# Patient Record
Sex: Female | Born: 2000 | Race: Black or African American | Hispanic: No | Marital: Single | Smoking: Never smoker
Health system: Southern US, Community
[De-identification: ages and names within clinical notes are randomized; demographics above are authoritative.]

## PROBLEM LIST (undated history)

## (undated) HISTORY — PX: NO PAST SURGERIES: SHX2092

---
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
## 2020-08-16 ENCOUNTER — Ambulatory Visit: Payer: Medicaid Other | Attending: Internal Medicine | Admitting: Internal Medicine

## 2020-08-16 ENCOUNTER — Encounter: Payer: Self-pay | Admitting: Internal Medicine

## 2020-08-16 ENCOUNTER — Other Ambulatory Visit: Payer: Self-pay

## 2020-08-16 VITALS — BP 120/85 | HR 68 | Temp 99.4°F | Resp 16 | Ht 67.0 in | Wt 226.0 lb

## 2020-08-16 DIAGNOSIS — N926 Irregular menstruation, unspecified: Secondary | ICD-10-CM

## 2020-08-16 DIAGNOSIS — Z6835 Body mass index (BMI) 35.0-35.9, adult: Secondary | ICD-10-CM

## 2020-08-16 DIAGNOSIS — N911 Secondary amenorrhea: Secondary | ICD-10-CM | POA: Insufficient documentation

## 2020-08-16 DIAGNOSIS — E66812 Obesity, class 2: Secondary | ICD-10-CM | POA: Insufficient documentation

## 2020-08-16 DIAGNOSIS — E6609 Other obesity due to excess calories: Secondary | ICD-10-CM

## 2020-08-16 DIAGNOSIS — R030 Elevated blood-pressure reading, without diagnosis of hypertension: Secondary | ICD-10-CM

## 2020-08-16 MED ORDER — NORGESTREL-ETHINYL ESTRADIOL 0.3-30 MG-MCG PO TABS: 1.0000 | ORAL_TABLET | Freq: Every day | ORAL | 2 refills | Status: DC

## 2020-08-16 MED FILL — ELINEST 0.3-30 MG-MCG TABS: 0.3-30 | 28 days supply | Qty: 28 | Fill #0

## 2020-08-16 NOTE — Patient Instructions (Signed)
Polycystic Ovarian Syndrome  Polycystic ovarian syndrome (PCOS) is a common hormonal disorder among women of reproductive age. In most women with PCOS, many small fluid-filled sacs (cysts) grow on the ovaries, and the cysts are not part of a normal menstrual cycle. PCOS can cause problems with your menstrual periods and make it difficult to get pregnant. It can also cause an increased risk of miscarriage with pregnancy. If it is not treated, PCOS can lead to serious health problems, such as diabetes and heart disease. What are the causes? The cause of PCOS is not known, but it may be the result of a combination of certain factors, such as:  Irregular menstrual cycle.  High levels of certain hormones (androgens).  Problems with the hormone that helps to control blood sugar (insulin resistance).  Certain genes. What increases the risk? This condition is more likely to develop in women who have a family history of PCOS. What are the signs or symptoms? Symptoms of PCOS may include:  Multiple ovarian cysts.  Infrequent periods or no periods.  Periods that are too frequent or too heavy.  Unpredictable periods.  Inability to get pregnant (infertility) because of not ovulating.  Increased growth of hair on the face, chest, stomach, back, thumbs, thighs, or toes.  Acne or oily skin. Acne may develop during adulthood, and it may not respond to treatment.  Pelvic pain.  Weight gain or obesity.  Patches of thickened and dark brown or black skin on the neck, arms, breasts, or thighs (acanthosis nigricans).  Excess hair growth on the face, chest, abdomen, or upper thighs (hirsutism). How is this diagnosed? This condition is diagnosed based on:  Your medical history.  A physical exam, including a pelvic exam. Your health care provider may look for areas of increased hair growth on your skin.  Tests, such as: ? Ultrasound. This may be used to examine the ovaries and the lining of the  uterus (endometrium) for cysts. ? Blood tests. These may be used to check levels of sugar (glucose), female hormone (testosterone), and female hormones (estrogen and progesterone) in your blood. How is this treated? There is no cure for PCOS, but treatment can help to manage symptoms and prevent more health problems from developing. Treatment varies depending on:  Your symptoms.  Whether you want to have a baby or whether you need birth control (contraception). Treatment may include nutrition and lifestyle changes along with:  Progesterone hormone to start a menstrual period.  Birth control pills to help you have regular menstrual periods.  Medicines to make you ovulate, if you want to get pregnant.  Medicine to reduce excessive hair growth.  Surgery, in severe cases. This may involve making small holes in one or both of your ovaries. This decreases the amount of testosterone that your body produces. Follow these instructions at home:  Take over-the-counter and prescription medicines only as told by your health care provider.  Follow a healthy meal plan. This can help you reduce the effects of PCOS. ? Eat a healthy diet that includes lean proteins, complex carbohydrates, fresh fruits and vegetables, low-fat dairy products, and healthy fats. Make sure to eat enough fiber.  If you are overweight, lose weight as told by your health care provider. ? Losing 10% of your body weight may improve symptoms. ? Your health care provider can determine how much weight loss is best for you and can help you lose weight safely.  Keep all follow-up visits as told by your health care provider.   This is important. Contact a health care provider if:  Your symptoms do not get better with medicine.  You develop new symptoms. This information is not intended to replace advice given to you by your health care provider. Make sure you discuss any questions you have with your health care provider. Document  Revised: 11/20/2017 Document Reviewed: 05/25/2016 Elsevier Patient Education  2020 Elsevier Inc.   DASH Eating Plan DASH stands for "Dietary Approaches to Stop Hypertension." The DASH eating plan is a healthy eating plan that has been shown to reduce high blood pressure (hypertension). It may also reduce your risk for type 2 diabetes, heart disease, and stroke. The DASH eating plan may also help with weight loss. What are tips for following this plan?  General guidelines  Avoid eating more than 2,300 mg (milligrams) of salt (sodium) a day. If you have hypertension, you may need to reduce your sodium intake to 1,500 mg a day.  Limit alcohol intake to no more than 1 drink a day for nonpregnant women and 2 drinks a day for men. One drink equals 12 oz of beer, 5 oz of wine, or 1 oz of hard liquor.  Work with your health care provider to maintain a healthy body weight or to lose weight. Ask what an ideal weight is for you.  Get at least 30 minutes of exercise that causes your heart to beat faster (aerobic exercise) most days of the week. Activities may include walking, swimming, or biking.  Work with your health care provider or diet and nutrition specialist (dietitian) to adjust your eating plan to your individual calorie needs. Reading food labels   Check food labels for the amount of sodium per serving. Choose foods with less than 5 percent of the Daily Value of sodium. Generally, foods with less than 300 mg of sodium per serving fit into this eating plan.  To find whole grains, look for the word "whole" as the first word in the ingredient list. Shopping  Buy products labeled as "low-sodium" or "no salt added."  Buy fresh foods. Avoid canned foods and premade or frozen meals. Cooking  Avoid adding salt when cooking. Use salt-free seasonings or herbs instead of table salt or sea salt. Check with your health care provider or pharmacist before using salt substitutes.  Do not fry foods.  Cook foods using healthy methods such as baking, boiling, grilling, and broiling instead.  Cook with heart-healthy oils, such as olive, canola, soybean, or sunflower oil. Meal planning  Eat a balanced diet that includes: ? 5 or more servings of fruits and vegetables each day. At each meal, try to fill half of your plate with fruits and vegetables. ? Up to 6-8 servings of whole grains each day. ? Less than 6 oz of lean meat, poultry, or fish each day. A 3-oz serving of meat is about the same size as a deck of cards. One egg equals 1 oz. ? 2 servings of low-fat dairy each day. ? A serving of nuts, seeds, or beans 5 times each week. ? Heart-healthy fats. Healthy fats called Omega-3 fatty acids are found in foods such as flaxseeds and coldwater fish, like sardines, salmon, and mackerel.  Limit how much you eat of the following: ? Canned or prepackaged foods. ? Food that is high in trans fat, such as fried foods. ? Food that is high in saturated fat, such as fatty meat. ? Sweets, desserts, sugary drinks, and other foods with added sugar. ? Full-fat dairy products.  Do not salt foods before eating.  Try to eat at least 2 vegetarian meals each week.  Eat more home-cooked food and less restaurant, buffet, and fast food.  When eating at a restaurant, ask that your food be prepared with less salt or no salt, if possible. What foods are recommended? The items listed may not be a complete list. Talk with your dietitian about what dietary choices are best for you. Grains Whole-grain or whole-wheat bread. Whole-grain or whole-wheat pasta. Brown rice. Orpah Cobb. Bulgur. Whole-grain and low-sodium cereals. Pita bread. Low-fat, low-sodium crackers. Whole-wheat flour tortillas. Vegetables Fresh or frozen vegetables (raw, steamed, roasted, or grilled). Low-sodium or reduced-sodium tomato and vegetable juice. Low-sodium or reduced-sodium tomato sauce and tomato paste. Low-sodium or reduced-sodium  canned vegetables. Fruits All fresh, dried, or frozen fruit. Canned fruit in natural juice (without added sugar). Meat and other protein foods Skinless chicken or [place]. Ground chicken or [place]. Pork with fat trimmed off. Fish and seafood. Egg whites. Dried beans, peas, or lentils. Unsalted nuts, nut butters, and seeds. Unsalted canned beans. Lean cuts of beef with fat trimmed off. Low-sodium, lean deli meat. Dairy Low-fat (1%) or fat-free (skim) milk. Fat-free, low-fat, or reduced-fat cheeses. Nonfat, low-sodium ricotta or cottage cheese. Low-fat or nonfat yogurt. Low-fat, low-sodium cheese. Fats and oils Soft margarine without trans fats. Vegetable oil. Low-fat, reduced-fat, or light mayonnaise and salad dressings (reduced-sodium). Canola, safflower, olive, soybean, and sunflower oils. Avocado. Seasoning and other foods Herbs. Spices. Seasoning mixes without salt. Unsalted popcorn and pretzels. Fat-free sweets. What foods are not recommended? The items listed may not be a complete list. Talk with your dietitian about what dietary choices are best for you. Grains Baked goods made with fat, such as croissants, muffins, or some breads. Dry pasta or rice meal packs. Vegetables Creamed or fried vegetables. Vegetables in a cheese sauce. Regular canned vegetables (not low-sodium or reduced-sodium). Regular canned tomato sauce and paste (not low-sodium or reduced-sodium). Regular tomato and vegetable juice (not low-sodium or reduced-sodium). Rosita Fire. Olives. Fruits Canned fruit in a light or heavy syrup. Fried fruit. Fruit in cream or butter sauce. Meat and other protein foods Fatty cuts of meat. Ribs. Fried meat. Tomasa Blase. Sausage. Bologna and other processed lunch meats. Salami. Fatback. Hotdogs. Bratwurst. Salted nuts and seeds. Canned beans with added salt. Canned or smoked fish. Whole eggs or egg yolks. Chicken or [place] with skin. Dairy Whole or 2% milk, cream, and half-and-half. Whole or  full-fat cream cheese. Whole-fat or sweetened yogurt. Full-fat cheese. Nondairy creamers. Whipped toppings. Processed cheese and cheese spreads. Fats and oils Butter. Stick margarine. Lard. Shortening. Ghee. Bacon fat. Tropical oils, such as coconut, palm kernel, or palm oil. Seasoning and other foods Salted popcorn and pretzels. Onion salt, garlic salt, seasoned salt, table salt, and sea salt. Worcestershire sauce. Tartar sauce. Barbecue sauce. Teriyaki sauce. Soy sauce, including reduced-sodium. Steak sauce. Canned and packaged gravies. Fish sauce. Oyster sauce. Cocktail sauce. Horseradish that you find on the shelf. Ketchup. Mustard. Meat flavorings and tenderizers. Bouillon cubes. Hot sauce and Tabasco sauce. Premade or packaged marinades. Premade or packaged taco seasonings. Relishes. Regular salad dressings. Where to find more information:  National Heart, Lung, and Blood Institute: PopSteam.is  American Heart Association: www.heart.org Summary  The DASH eating plan is a healthy eating plan that has been shown to reduce high blood pressure (hypertension). It may also reduce your risk for type 2 diabetes, heart disease, and stroke.  With the DASH eating plan, you should limit salt (sodium)  intake to 2,300 mg a day. If you have hypertension, you may need to reduce your sodium intake to 1,500 mg a day.  When on the DASH eating plan, aim to eat more fresh fruits and vegetables, whole grains, lean proteins, low-fat dairy, and heart-healthy fats.  Work with your health care provider or diet and nutrition specialist (dietitian) to adjust your eating plan to your individual calorie needs. This information is not intended to replace advice given to you by your health care provider. Make sure you discuss any questions you have with your health care provider. Document Revised: 11/20/2017 Document Reviewed: 12/01/2016 Elsevier Patient Education  2020 [employer].

## 2020-08-16 NOTE — Progress Notes (Signed)
Pt states she has questions regarding her menstrual cycle

## 2020-08-16 NOTE — Progress Notes (Signed)
Patient ID: Pamela Barton, female    DOB: 2001/01/05  MRN: [id number]  CC: New Patient (Initial Visit)   Subjective: Pamela Barton is a 19 y.o. female who presents for new pt visit Her concerns today include:   Previous PCP was in [place].  No previous chronic med conditions.  Not on any meds.    Pt c/o irregular menses since she was younger.  Breast development started at age 80.  Menses started at age 15. Initial menses lasted 2 wks then no cycle again until she was 19 yrs old and lasted 2 mths.  Nothing again until 3 mths ago.  That bleeding lasted 1 mth.  Not on any method of birth control.  Never tried on birth control to try to regulate cycles. -menses heavy for first 2 days then subsequent days are lighter. -had pelvic exam by PCP last yr and CT scan.  She was told by her mom that CT scan may have shown an ovarian cyst.  -endorses hair growth on chin that started at age 62.  She shaves Q 2-3 days.   Some deepen of voice.   -not sexually active and has never been in the past..  Obesity:  Reports she does poorly with eating habits.  She tends to not eat well for several days and then overeats.  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  She also gets an exercise outside of work by watching and following YouTube exercise videos or jogging for 10 minutes.  Past medical, surgical, social, family history reviewed and updated.  No Known Allergies  Social History   Socioeconomic History  . Marital status: Single    Spouse name: Not on file  . Number of children: 0  . Years of education: 7  . Highest education level: Not on file  Occupational History  . Occupation: janitor  Tobacco Use  . Smoking status: Never Smoker  . Smokeless tobacco: Never Used  Vaping Use  . Vaping Use: Never used  Substance and Sexual Activity  . Alcohol use: Never  . Drug use: Never  . Sexual activity: Never  Other Topics Concern  . Not on file  Social History Narrative  . Not on file    Social Determinants of Health   Financial Resource Strain:   . Difficulty of Paying Living Expenses: Not on file  Food Insecurity:   . Worried About Programme researcher, broadcasting/film/video in the Last Year: Not on file  . Ran Out of Food in the Last Year: Not on file  Transportation Needs:   . Lack of Transportation (Medical): Not on file  . Lack of Transportation (Non-Medical): Not on file  Physical Activity:   . Days of Exercise per Week: Not on file  . Minutes of Exercise per Session: Not on file  Stress:   . Feeling of Stress : Not on file  Social Connections:   . Frequency of Communication with Friends and Family: Not on file  . Frequency of Social Gatherings with Friends and Family: Not on file  . Attends Religious Services: Not on file  . Active Member of Clubs or Organizations: Not on file  . Attends Banker Meetings: Not on file  . Marital Status: Not on file  Intimate Partner Violence:   . Fear of Current or Ex-Partner: Not on file  . Emotionally Abused: Not on file  . Physically Abused: Not on file  . Sexually Abused: Not on file  Family History  Problem Relation Age of Onset  . Diabetes Paternal Grandmother     Past Surgical History:  Procedure Laterality Date  . NO PAST SURGERIES      ROS: Review of Systems Negative except as stated above  PHYSICAL EXAM: BP 123/85   Pulse 68   Temp 99.4 F (37.4 C)   Resp 16   Ht 5\' 7"  (1.702 m)   Wt 226 lb (102.5 kg)   SpO2 100%   BMI 35.40 kg/m   Blood pressure 120/85 bilaterally. Physical Exam  General appearance - alert, well appearing, and in no distress Mental status - normal mood, behavior, speech, dress, motor activity, and thought processes Eyes - pupils equal and reactive, extraocular eye movements intact Mouth - mucous membranes moist, pharynx normal without lesions Neck - supple, no significant adenopathy Chest - clear to auscultation, no wheezes, rales or rhonchi, symmetric air entry Heart -  normal rate, regular rhythm, normal S1, S2, no murmurs, rubs, clicks or gallops Extremities - no LE edema Skin - acanthosis nigricans around neck line Acanthosis nigricans.around neck line.  Mild hirsutism on extremities, trunk.  Spares hirsutism on breast, mild under chin  ASSESSMENT AND PLAN: 1. Secondary amenorrhea Likely anovulatory cycle due to PCOS. Discussed dx with pt and management.  Recommend trail of BCP to help regulate menses vs Provera once a mth for 1 wk to bring about withdrawal bleeding once a mth.  Pt willing to try the BCP.  Went over possible side of DVT/PE and signs/symptoms to watch for.  No previous hx of blood clots or liver ds. Declines urine pregnancy test as she is not sexually active. -went over how to take BCP and when to expect withdrawal bleeding.   - TSH - Prolactin  2. Irregular menses See #1 above - TSH - Follicle stimulating hormone - Luteinizing hormone - Prolactin - norgestrel-ethinyl estradiol (LO/OVRAL) 0.3-30 MG-MCG tablet; Take 1 tablet by mouth daily.  Dispense: 28 tablet; Refill: 2  3. Class 2 obesity due to excess calories without serious comorbidity with body mass index (BMI) of 35.0 to 35.9 in adult -discuss importance of wealthy eating habits.  Discuss importance of smaller portions, incorporating fresh fruits and veggies in the diet, cutting back on white carbs and sugar drinks. -encourage some form of moderate intensity exercise 3-4 days a week for 30-45 mins - Hemoglobin A1c  4. Elevated blood-pressure reading without diagnosis of hypertension DASH diet discussed and encouraged.  Plan to recheck BP on f/u visit - Basic Metabolic Panel     Patient was given the opportunity to ask questions.  Patient verbalized understanding of the plan and was able to repeat key elements of the plan.   Orders Placed This Encounter  Procedures  . TSH  . Follicle stimulating hormone  . Luteinizing hormone  . Hemoglobin A1c  . Prolactin  . Basic  Metabolic Panel     Requested Prescriptions   Signed Prescriptions Disp Refills  . norgestrel-ethinyl estradiol (LO/OVRAL) 0.3-30 MG-MCG tablet 28 tablet 2    Sig: Take 1 tablet by mouth daily.    Return in about 6 weeks (around 09/27/2020).  11/27/2020, MD, FACP

## 2020-08-17 LAB — BASIC METABOLIC PANEL
BUN/Creatinine Ratio: 13 (ref 9–23)
BUN: 11 mg/dL (ref 6–20)
CO2: 25 mmol/L (ref 20–29)
Calcium: 10.4 mg/dL — ABNORMAL HIGH (ref 8.7–10.2)
Chloride: 100 mmol/L (ref 96–106)
Creatinine, Ser: 0.85 mg/dL (ref 0.57–1.00)
GFR calc Af Amer: 115 mL/min/{1.73_m2} (ref >59)
GFR calc non Af Amer: 100 mL/min/{1.73_m2} (ref >59)
Glucose: 80 mg/dL (ref 65–99)
Potassium: 5.1 mmol/L (ref 3.5–5.2)
Sodium: 139 mmol/L (ref 134–144)

## 2020-08-17 LAB — HEMOGLOBIN A1C
Est. average glucose Bld gHb Est-mCnc: 120 mg/dL
Hgb A1c MFr Bld: 5.8 % — ABNORMAL HIGH (ref 4.8–5.6)

## 2020-08-17 LAB — PROLACTIN: Prolactin: 15.9 ng/mL (ref 4.8–23.3)

## 2020-08-17 LAB — LUTEINIZING HORMONE: LH: 14.4 m[IU]/mL

## 2020-08-17 LAB — TSH: TSH: 1.88 u[IU]/mL (ref 0.450–4.500)

## 2020-08-17 LAB — FOLLICLE STIMULATING HORMONE: FSH: 3.8 m[IU]/mL

## 2020-08-18 ENCOUNTER — Telehealth: Payer: Self-pay

## 2020-08-18 NOTE — Telephone Encounter (Signed)
Contacted pt to go over lab results pt is aware and doesn't have any questions or concerns 

## 2020-10-22 ENCOUNTER — Ambulatory Visit: Payer: Medicaid Other | Attending: Internal Medicine | Admitting: Internal Medicine

## 2020-10-22 ENCOUNTER — Encounter: Payer: Self-pay | Admitting: Internal Medicine

## 2020-10-22 ENCOUNTER — Other Ambulatory Visit: Payer: Self-pay

## 2020-10-22 VITALS — BP 115/75 | HR 76 | Resp 16 | Wt 224.6 lb

## 2020-10-22 DIAGNOSIS — E6609 Other obesity due to excess calories: Secondary | ICD-10-CM

## 2020-10-22 DIAGNOSIS — Z1331 Encounter for screening for depression: Secondary | ICD-10-CM

## 2020-10-22 DIAGNOSIS — Z6835 Body mass index (BMI) 35.0-35.9, adult: Secondary | ICD-10-CM

## 2020-10-22 DIAGNOSIS — N926 Irregular menstruation, unspecified: Secondary | ICD-10-CM | POA: Diagnosis not present

## 2020-10-22 DIAGNOSIS — R7303 Prediabetes: Secondary | ICD-10-CM | POA: Diagnosis not present

## 2020-10-22 DIAGNOSIS — Z2821 Immunization not carried out because of patient refusal: Secondary | ICD-10-CM

## 2020-10-22 MED ORDER — NORGESTREL-ETHINYL ESTRADIOL 0.3-30 MG-MCG PO TABS: 1.0000 | ORAL_TABLET | Freq: Every day | ORAL | 6 refills | Status: AC

## 2020-10-22 NOTE — Patient Instructions (Signed)
Please bring me a copy of your MRI report.   I have sent refills on the birth control pill.   Prediabetes Eating Plan Prediabetes is a condition that causes blood sugar (glucose) levels to be higher than normal. This increases the risk for developing diabetes. In order to prevent diabetes from developing, your health care provider may recommend a diet and other lifestyle changes to help you:  Control your blood glucose levels.  Improve your cholesterol levels.  Manage your blood pressure. Your health care provider may recommend working with a diet and nutrition specialist (dietitian) to make a meal plan that is best for you. What are tips for following this plan? Lifestyle  Set weight loss goals with the help of your health care team. It is recommended that most people with prediabetes lose 7% of their current body weight.  Exercise for at least 30 minutes at least 5 days a week.  Attend a support group or seek ongoing support from a mental health counselor.  Take over-the-counter and prescription medicines only as told by your health care provider. Reading food labels  Read food labels to check the amount of fat, salt (sodium), and sugar in prepackaged foods. Avoid foods that have: ? Saturated fats. ? Trans fats. ? Added sugars.  Avoid foods that have more than 300 milligrams (mg) of sodium per serving. Limit your daily sodium intake to less than 2,300 mg each day. Shopping  Avoid buying pre-made and processed foods. Cooking  Cook with olive oil. Do not use butter, lard, or ghee.  Bake, broil, grill, or boil foods. Avoid frying. Meal planning   Work with your dietitian to develop an eating plan that is right for you. This may include: ? Tracking how many calories you take in. Use a food diary, notebook, or mobile application to track what you eat at each meal. ? Using the glycemic index (GI) to plan your meals. The index tells you how quickly a food will raise your blood  glucose. Choose low-GI foods. These foods take a longer time to raise blood glucose.  Consider following a Mediterranean diet. This diet includes: ? Several servings each day of fresh fruits and vegetables. ? Eating fish at least twice a week. ? Several servings each day of whole grains, beans, nuts, and seeds. ? Using olive oil instead of other fats. ? Moderate alcohol consumption. ? Eating small amounts of red meat and whole-fat dairy.  If you have high blood pressure, you may need to limit your sodium intake or follow a diet such as the DASH eating plan. DASH is an eating plan that aims to lower high blood pressure. What foods are recommended? The items listed below may not be a complete list. Talk with your dietitian about what dietary choices are best for you. Grains Whole grains, such as whole-wheat or whole-grain breads, crackers, cereals, and pasta. Unsweetened oatmeal. Bulgur. Barley. Quinoa. Brown rice. Corn or whole-wheat flour tortillas or taco shells. Vegetables Lettuce. Spinach. Peas. Beets. Cauliflower. Cabbage. Broccoli. Carrots. Tomatoes. Squash. Eggplant. Herbs. Peppers. Onions. Cucumbers. Brussels sprouts. Fruits Berries. Bananas. Apples. Oranges. Grapes. Papaya. Mango. Pomegranate. Kiwi. Grapefruit. Cherries. Meats and other protein foods Seafood. Poultry without skin. Lean cuts of pork and beef. Tofu. Eggs. Nuts. Beans. Dairy Low-fat or fat-free dairy products, such as yogurt, cottage cheese, and cheese. Beverages Water. Tea. Coffee. Sugar-free or diet soda. Seltzer water. Lowfat or no-fat milk. Milk alternatives, such as soy or almond milk. Fats and oils Olive oil. Canola oil.  Sunflower oil. Grapeseed oil. Avocado. Walnuts. Sweets and desserts Sugar-free or low-fat pudding. Sugar-free or low-fat ice cream and other frozen treats. Seasoning and other foods Herbs. Sodium-free spices. Mustard. Relish. Low-fat, low-sugar ketchup. Low-fat, low-sugar barbecue sauce.  Low-fat or fat-free mayonnaise. What foods are not recommended? The items listed below may not be a complete list. Talk with your dietitian about what dietary choices are best for you. Grains Refined white flour and flour products, such as bread, pasta, snack foods, and cereals. Vegetables Canned vegetables. Frozen vegetables with butter or cream sauce. Fruits Fruits canned with syrup. Meats and other protein foods Fatty cuts of meat. Poultry with skin. Breaded or fried meat. Processed meats. Dairy Full-fat yogurt, cheese, or milk. Beverages Sweetened drinks, such as sweet iced tea and soda. Fats and oils Butter. Lard. Ghee. Sweets and desserts Baked goods, such as cake, cupcakes, pastries, cookies, and cheesecake. Seasoning and other foods Spice mixes with added salt. Ketchup. Barbecue sauce. Mayonnaise. Summary  To prevent diabetes from developing, you may need to make diet and other lifestyle changes to help control blood sugar, improve cholesterol levels, and manage your blood pressure.  Set weight loss goals with the help of your health care team. It is recommended that most people with prediabetes lose 7 percent of their current body weight.  Consider following a Mediterranean diet that includes plenty of fresh fruits and vegetables, whole grains, beans, nuts, seeds, fish, lean meat, low-fat dairy, and healthy oils. This information is not intended to replace advice given to you by your health care provider. Make sure you discuss any questions you have with your health care provider. Document Revised: 04/01/2019 Document Reviewed: 02/11/2017 Elsevier Patient Education  2020 [employer].

## 2020-10-22 NOTE — Progress Notes (Signed)
Patient ID: Pamela Barton, female    DOB: 31-Jul-2001  MRN: [id number]  CC: Follow-up   Subjective: Pamela Barton is a 19 y.o. female who presents for 6 weeks follow-up for irregular menses. Her concerns today include:  Patient with history of secondary amenorrhea, obesity, prediabetes   On her first visit with me in August, we discussed her irregular menses and secondary amenorrhea.  My thinking was that it was due to anovulatory cycle like PCOS.  She was started on LoOvral birth control pills to try to regulate her cycles.  Patient reports that she had a cycle in September that lasted 8 days and then 1 month later in October that lasted 6 days.  Both times menses came during the time she was taking the blank pills and the birth control pack.  Menses for this month just started today.  She is tolerating the birth control pills okay.  Her hormone levels were okay. -She was able to log into the system from her previous PCP to look for the CAT scan that she had done of the abdomen and pelvis.  She states that it was actually an MRI of the abdomen and pelvis and that the report said that her ovaries are large with the left side being larger than the right.  She did not bring a copy of the report for me to review today.  Obesity: She was found to have prediabetes on blood test.  Patient reports that she is moving more now that she is working at an [employer].  She works 10-hour shifts and is moving the whole time.  Doing better with eating habits.  Eating less.  Depression screen was positive on last visit with a PHQ-9 score of 12.  Today her score is 2.  She denies any major issue with depression at this time.  HM: She declines flu shot.  She has not had her COVID-19 vaccine as yet but states that she plans to get it 2 days before she travels to [place] at the end of this month.  Patient Active Problem List   Diagnosis Date Noted  . Secondary amenorrhea 08/16/2020  . Irregular  menses 08/16/2020  . Class 2 obesity due to excess calories without serious comorbidity with body mass index (BMI) of 35.0 to 35.9 in adult 08/16/2020     Current Outpatient Medications on File Prior to Visit  Medication Sig Dispense Refill  . norgestrel-ethinyl estradiol (LO/OVRAL) 0.3-30 MG-MCG tablet Take 1 tablet by mouth daily. 28 tablet 2   No current facility-administered medications on file prior to visit.    No Known Allergies  Social History   Socioeconomic History  . Marital status: Single    Spouse name: Not on file  . Number of children: 0  . Years of education: 30  . Highest education level: Not on file  Occupational History  . Occupation: janitor  Tobacco Use  . Smoking status: Never Smoker  . Smokeless tobacco: Never Used  Vaping Use  . Vaping Use: Never used  Substance and Sexual Activity  . Alcohol use: Never  . Drug use: Never  . Sexual activity: Never  Other Topics Concern  . Not on file  Social History Narrative  . Not on file   Social Determinants of Health   Financial Resource Strain:   . Difficulty of Paying Living Expenses: Not on file  Food Insecurity:   . Worried About Programme researcher, broadcasting/film/video in the Last Year: Not on file  .  Ran Out of Food in the Last Year: Not on file  Transportation Needs:   . Lack of Transportation (Medical): Not on file  . Lack of Transportation (Non-Medical): Not on file  Physical Activity:   . Days of Exercise per Week: Not on file  . Minutes of Exercise per Session: Not on file  Stress:   . Feeling of Stress : Not on file  Social Connections:   . Frequency of Communication with Friends and Family: Not on file  . Frequency of Social Gatherings with Friends and Family: Not on file  . Attends Religious Services: Not on file  . Active Member of Clubs or Organizations: Not on file  . Attends Banker Meetings: Not on file  . Marital Status: Not on file  Intimate Partner Violence:   . Fear of Current or  Ex-Partner: Not on file  . Emotionally Abused: Not on file  . Physically Abused: Not on file  . Sexually Abused: Not on file    Family History  Problem Relation Age of Onset  . Diabetes Paternal Grandmother     Past Surgical History:  Procedure Laterality Date  . NO PAST SURGERIES      ROS: Review of Systems Negative except as stated above  PHYSICAL EXAM: BP 115/75   Pulse 76   Resp 16   Wt 224 lb 9.6 oz (101.9 kg)   SpO2 96%   BMI 35.18 kg/m   Wt Readings from Last 3 Encounters:  10/22/20 224 lb 9.6 oz (101.9 kg) (99 %, Z= 2.25)*  08/16/20 226 lb (102.5 kg) (99 %, Z= 2.27)*   * Growth percentiles are based on CDC (Girls, 2-20 Years) data.    Physical Exam  General appearance - alert, well appearing, and in no distress Mental status - normal mood, behavior, speech, dress, motor activity, and thought processes Chest - clear to auscultation, no wheezes, rales or rhonchi, symmetric air entry Heart - normal rate, regular rhythm, normal S1, S2, no murmurs, rubs, clicks or gallops  Depression screen [facility] 2/9 10/22/2020 08/16/2020  Decreased Interest 1 2  Down, Depressed, Hopeless 0 1  PHQ - 2 Score 1 3  Altered sleeping - 3  Tired, decreased energy - 3  Change in appetite - 3  Feeling bad or failure about yourself  - 0  Trouble concentrating - 0  Moving slowly or fidgety/restless - 0  Suicidal thoughts - 0  PHQ-9 Score - 12    CMP Latest Ref Rng & Units 08/16/2020  Glucose 65 - 99 mg/dL 80  BUN 6 - 20 mg/dL 11  Creatinine 4.33 - 2.95 mg/dL 1.88  Sodium 416 - 606 mmol/L 139  Potassium 3.5 - 5.2 mmol/L 5.1  Chloride 96 - 106 mmol/L 100  CO2 20 - 29 mmol/L 25  Calcium 8.7 - 10.2 mg/dL 10.4(H)   Lipid Panel  No results found for: CHOL, TRIG, HDL, CHOLHDL, VLDL, LDLCALC, LDLDIRECT  CBC No results found for: WBC, RBC, HGB, HCT, PLT, MCV, MCH, MCHC, RDW, LYMPHSABS, MONOABS, EOSABS, BASOSABS  ASSESSMENT AND PLAN:  1. Irregular menses Menses have become more  regular.  She is tolerating the birth control pills.  She will continue taking for now.  I have asked that she print a copy of the MRI report of the abdomen and pelvis and drop off a copy for me to review. - norgestrel-ethinyl estradiol (LO/OVRAL) 0.3-30 MG-MCG tablet; Take 1 tablet by mouth daily.  Dispense: 28 tablet; Refill: 6  2.  Class 2 obesity due to excess calories without serious comorbidity with body mass index (BMI) of 35.0 to 35.9 in adult Weight has remained fairly stable since I last saw her 6 weeks ago.  Encouraged her to continue to move as much as she can.  Encourage healthy eating habits.  3. Prediabetes See #2 above.  Printed information given on dietary recommendations for prediabetes  4. Positive depression screening This was positive on last visit but she scored only a 2 today.  Reports that depression is not a major issue for her at this time.  5. Influenza vaccination declined This was recommended.  Patient declined. In regards to her getting the COVID-19 vaccine, I told her that if she hopes to be protected with the vaccine she needs to have completed the vaccine at least 2 weeks before travel.    Patient was given the opportunity to ask questions.  Patient verbalized understanding of the plan and was able to repeat key elements of the plan.   No orders of the defined types were placed in this encounter.    Requested Prescriptions    No prescriptions requested or ordered in this encounter    No follow-ups on file.  Jonah Blue, MD, FACP

## 2021-06-23 ENCOUNTER — Emergency Department (HOSPITAL_COMMUNITY): Payer: Medicaid Other

## 2021-06-23 ENCOUNTER — Emergency Department (HOSPITAL_COMMUNITY)
Admission: EM | Admit: 2021-06-23 | Discharge: 2021-06-23 | Disposition: A | Discharge status: Home / Self Care | Payer: Medicaid Other | Attending: Emergency Medicine | Admitting: Emergency Medicine

## 2021-06-23 DIAGNOSIS — Y9301 Activity, walking, marching and hiking: Secondary | ICD-10-CM | POA: Insufficient documentation

## 2021-06-23 DIAGNOSIS — S0990XA Unspecified injury of head, initial encounter: Secondary | ICD-10-CM

## 2021-06-23 DIAGNOSIS — W01198A Fall on same level from slipping, tripping and stumbling with subsequent striking against other object, initial encounter: Secondary | ICD-10-CM | POA: Insufficient documentation

## 2021-06-23 DIAGNOSIS — Y9234 Swimming pool (public) as the place of occurrence of the external cause: Secondary | ICD-10-CM | POA: Insufficient documentation

## 2021-06-23 DIAGNOSIS — S060X0A Concussion without loss of consciousness, initial encounter: Secondary | ICD-10-CM | POA: Diagnosis not present

## 2021-06-23 LAB — CBC WITH DIFFERENTIAL/PLATELET
Abs Immature Granulocytes: 0.03 10*3/uL (ref 0.00–0.07)
Basophils Absolute: 0 10*3/uL (ref 0.0–0.1)
Basophils Relative: 0 %
Eosinophils Absolute: 0.1 10*3/uL (ref 0.0–0.5)
Eosinophils Relative: 1 %
HCT: 42.7 % (ref 36.0–46.0)
Hemoglobin: 13.4 g/dL (ref 12.0–15.0)
Immature Granulocytes: 0 %
Lymphocytes Relative: 15 %
Lymphs Abs: 1.8 10*3/uL (ref 0.7–4.0)
MCH: 28.6 pg (ref 26.0–34.0)
MCHC: 31.4 g/dL (ref 30.0–36.0)
MCV: 91 fL (ref 80.0–100.0)
Monocytes Absolute: 0.6 10*3/uL (ref 0.1–1.0)
Monocytes Relative: 5 %
Neutro Abs: 9.6 10*3/uL — ABNORMAL HIGH (ref 1.7–7.7)
Neutrophils Relative %: 79 %
Platelets: 224 10*3/uL (ref 150–400)
RBC: 4.69 MIL/uL (ref 3.87–5.11)
RDW: 13.4 % (ref 11.5–15.5)
WBC: 12.2 10*3/uL — ABNORMAL HIGH (ref 4.0–10.5)
nRBC: 0 % (ref 0.0–0.2)

## 2021-06-23 LAB — BASIC METABOLIC PANEL
Anion gap: 9 (ref 5–15)
BUN: 15 mg/dL (ref 6–20)
CO2: 23 mmol/L (ref 22–32)
Calcium: 9.8 mg/dL (ref 8.9–10.3)
Chloride: 106 mmol/L (ref 98–111)
Creatinine, Ser: 0.93 mg/dL (ref 0.44–1.00)
GFR, Estimated: >60 mL/min (ref >60)
Glucose, Bld: 100 mg/dL — ABNORMAL HIGH (ref 70–99)
Potassium: 4.3 mmol/L (ref 3.5–5.1)
Sodium: 138 mmol/L (ref 135–145)

## 2021-06-23 LAB — I-STAT BETA HCG BLOOD, ED (MC, WL, AP ONLY): I-stat hCG, quantitative: <5 m[IU]/mL (ref <5)

## 2021-06-23 MED ORDER — ACETAMINOPHEN 325 MG PO TABS
650.0000 mg | ORAL_TABLET | Freq: Once | ORAL | Status: AC
  Administered 2021-06-23: 650 mg via ORAL
  Filled 2021-06-23: qty 2

## 2021-06-23 MED ORDER — ONDANSETRON 4 MG PO TBDP
8.0000 mg | ORAL_TABLET | Freq: Once | ORAL | Status: AC
Start: 2021-06-23 — End: 2021-06-23
  Administered 2021-06-23: 8 mg via ORAL
  Filled 2021-06-23: qty 2

## 2021-06-23 NOTE — ED Triage Notes (Signed)
Pt states she hit head at pool appox 2hrs ago, 9/10 "throbbing, constant" pain, nausea, vomiting since. Endorses lightheadedness, dizziness, vision changes/ photosensitivity

## 2021-06-23 NOTE — ED Provider Notes (Signed)
Emergency Medicine Provider Triage Evaluation Note  Pamela Barton , a 20 y.o. female  was evaluated in triage.  Pt complains of head injury.  Approximate 2 hours prior multiple.  Patient reports that she patient denies loss of consciousness states everything went black for a second."  Positional head "throbbing, constant, headache since then.  Patient endorses nausea and vomiting.   Nausea vomiting 3 times since her head injury.  Patient denies any pain to her neck or back.  Patient reports that she feels numbness over her entire body.  Denies any weakness, visual disturbance.    Review of Systems  Positive: Headache, nausea, vomiting, numbness Negative: Disturbance, neck pain, back pain, weakness  Physical Exam  BP 119/70 (BP Location: Left Arm)   Pulse 62   Temp 97.6 F (36.4 C) (Oral)   Resp 16   SpO2 97%  Gen:   Awake, no distress   Resp:  Normal effort  MSK:   Moves extremities without difficulty  Other:  CN II through XII intact, strength equal bilaterally.  +5 strength to bilateral lower extremities.  Sensation to light touch intact to bilateral upper and lower extremities.   contusion to occipital   Medical Decision Making  Medically screening exam initiated at 3:49 PM.  Appropriate orders placed.  Shakiya Mcneary was informed that the remainder of the evaluation will be completed by another provider, this initial triage assessment does not replace that evaluation, and the importance of remaining in the ED until their evaluation is complete.  Due to persistent nausea and vomiting will obtain noncontrast head CT.  The patient appears stable so that the remainder of the work up may be completed by another provider.      Haskel Schroeder, PA-C 06/23/21 1555    Koleen Distance, MD 06/23/21 [phone]

## 2021-06-23 NOTE — ED Provider Notes (Signed)
[facility] EMERGENCY DEPARTMENT Provider Note   CSN: [id number] Arrival date & time: 06/23/21  1426     History Chief Complaint  Patient presents with   Lytle Michaels    Pamela Barton is a 20 y.o. female.  Pamela Barton was going to open the gate around the pool.  She did not realize there was water, and she slipped.  This caused her to fall backwards and strike her head against the concrete.  The history is provided by the patient.  Head Injury Location:  Generalized Time since incident:  2 hours Mechanism of injury: fall   Fall:    Fall occurred:  Walking   Height of fall:  Standing height   Impact surface:  Product manager of impact:  Head   Entrapped after fall: no   Pain details:    Quality:  Throbbing   Severity:  Moderate   Duration:  2 hours   Timing:  Constant   Progression:  Unchanged Chronicity:  New Relieved by:  Nothing Worsened by:  Light Ineffective treatments:  None tried Associated symptoms: headache, nausea, numbness (right hand- now gone) and vomiting   Associated symptoms: no blurred vision, no disorientation, no double vision, no focal weakness, no loss of consciousness, no memory loss, no neck pain and no seizures   Risk factors comment:  No history of prior concussion     No past medical history on file.  Patient Active Problem List   Diagnosis Date Noted   Influenza vaccination declined 10/22/2020   Prediabetes 10/22/2020   Secondary amenorrhea 08/16/2020   Irregular menses 08/16/2020   Class 2 obesity due to excess calories without serious comorbidity with body mass index (BMI) of 35.0 to 35.9 in adult 08/16/2020    Past Surgical History:  Procedure Laterality Date   NO PAST SURGERIES       OB History   No obstetric history on file.     Family History  Problem Relation Age of Onset   Diabetes Paternal Grandmother     Social History   Tobacco Use   Smoking status: Never   Smokeless tobacco: Never   Vaping Use   Vaping Use: Never used  Substance Use Topics   Alcohol use: Never   Drug use: Never    Home Medications Prior to Admission medications   Medication Sig Start Date End Date Taking? Authorizing Provider  norgestrel-ethinyl estradiol (LO/OVRAL) 0.3-30 MG-MCG tablet Take 1 tablet by mouth daily. 10/22/20   Ladell Pier, MD    Allergies    Patient has no known allergies.  Review of Systems   Review of Systems  Constitutional:  Negative for chills and fever.  HENT:  Negative for ear pain and sore throat.   Eyes:  Negative for blurred vision, double vision, pain and visual disturbance.  Respiratory:  Negative for cough and shortness of breath.   Cardiovascular:  Negative for chest pain and palpitations.  Gastrointestinal:  Positive for nausea and vomiting. Negative for abdominal pain.  Genitourinary:  Negative for dysuria and hematuria.  Musculoskeletal:  Negative for arthralgias, back pain and neck pain.  Skin:  Negative for color change and rash.  Neurological:  Positive for numbness (right hand- now gone) and headaches. Negative for focal weakness, seizures, loss of consciousness and syncope.  Psychiatric/Behavioral:  Negative for memory loss.   All other systems reviewed and are negative.  Physical Exam Updated Vital Signs BP 119/70 (BP Location: Left Arm)   Pulse 62  Temp 97.6 F (36.4 C) (Oral)   Resp 16   SpO2 97%   Physical Exam Vitals and nursing note reviewed.  HENT:     Head: Normocephalic and atraumatic.     Nose: Nose normal.     Mouth/Throat:     Mouth: Mucous membranes are moist.     Comments: No dental trauma Eyes:     General: No scleral icterus.    Extraocular Movements: Extraocular movements intact.     Conjunctiva/sclera: Conjunctivae normal.     Pupils: Pupils are equal, round, and reactive to light.  Pulmonary:     Effort: Pulmonary effort is normal. No respiratory distress.  Musculoskeletal:        General: No deformity or  signs of injury.     Cervical back: Normal range of motion.  Skin:    General: Skin is warm and dry.  Neurological:     General: No focal deficit present.     Mental Status: She is alert and oriented to person, place, and time.     Cranial Nerves: No cranial nerve deficit.     Sensory: No sensory deficit.     Motor: No weakness.     Coordination: Coordination normal.  Psychiatric:        Mood and Affect: Mood normal.    ED Results / Procedures / Treatments   Labs (all labs ordered are listed, but only abnormal results are displayed) Labs Reviewed  BASIC METABOLIC PANEL - Abnormal; Notable for the following components:      Result Value   Glucose, Bld 100 (*)    All other components within normal limits  CBC WITH DIFFERENTIAL/PLATELET - Abnormal; Notable for the following components:   WBC 12.2 (*)    Neutro Abs 9.6 (*)    All other components within normal limits  I-STAT BETA HCG BLOOD, ED ([facility], [facility], [facility] ONLY)    EKG None  Radiology CT Head Wo Contrast  Result Date: 06/23/2021 CLINICAL DATA:  Head trauma. Repeat vomiting. Hit her head at the pool. EXAM: CT HEAD WITHOUT CONTRAST TECHNIQUE: Contiguous axial images were obtained from the base of the skull through the vertex without intravenous contrast. COMPARISON:  None. FINDINGS: Brain: No evidence of acute infarction, hemorrhage, hydrocephalus, extra-axial collection or mass lesion/mass effect. Vascular: No hyperdense vessel or unexpected calcification. Skull: Normal. Negative for fracture or focal lesion. Sinuses/Orbits: There is minimal paranasal sinus mucosal thickening. No air-fluid levels or sinus wall fractures. Other: RIGHT posterior occipital scalp hematoma without underlying fracture. IMPRESSION: 1.  No evidence for acute intracranial abnormality. 2. RIGHT posterior occipital scalp hematoma without underlying fracture. Electronically Signed   By: Nolon Nations M.D.   On: 06/23/2021 17:22    Procedures Procedures    Medications Ordered in ED Medications  ondansetron (ZOFRAN-ODT) disintegrating tablet 8 mg (has no administration in time range)  acetaminophen (TYLENOL) tablet 650 mg (has no administration in time range)    ED Course  I have reviewed the triage vital signs and the nursing notes.  Pertinent labs & imaging results that were available during my care of the patient were reviewed by me and considered in my medical decision making (see chart for details).    MDM Rules/Calculators/A&P                          Pamela Barton ensued after a head injury.  She met imaging criteria secondary to repetitive nausea and vomiting.  Neurologic exam  was normal.  Imaging was within normal limits.  She likely has a concussion and was given instructions regarding symptomatic management and return precautions. Final Clinical Impression(s) / ED Diagnoses Final diagnoses:  Injury of head, initial encounter  Concussion without loss of consciousness, initial encounter    Rx / DC Orders ED Discharge Orders     None        Arnaldo Natal, MD 06/23/21 [phone]

## 2021-08-30 IMAGING — CT CT HEAD W/O CM
4 series · 16 of 47 positions shown, 18 images · non-contrast
Comparison: None.

CLINICAL DATA: Head trauma. Repeat vomiting. Hit her head at the
pool.

EXAM:
CT HEAD WITHOUT CONTRAST
TECHNIQUE: Contiguous axial images were obtained from the base of the skull
through the vertex without intravenous contrast.

[Series 3: head wo · axial · 0.43mm/px · z∈[+891,+1006]mm · 7 of 31 slices shown, 9 images]
[im 4/31  brain]
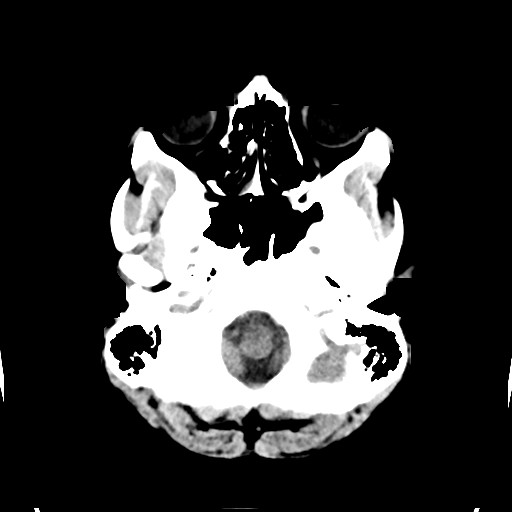
[im 4/31  bone]
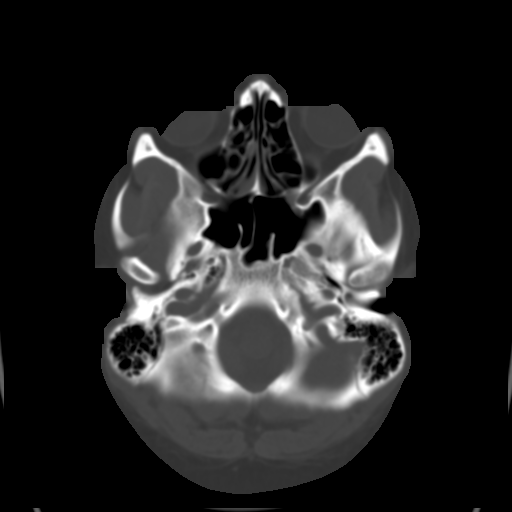
[im 8/31  brain]
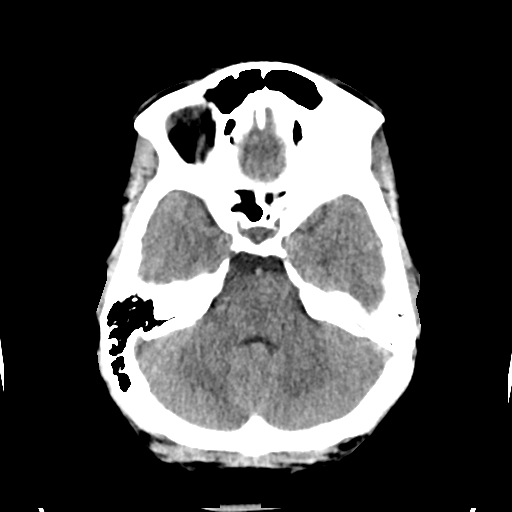
[im 12/31  brain]
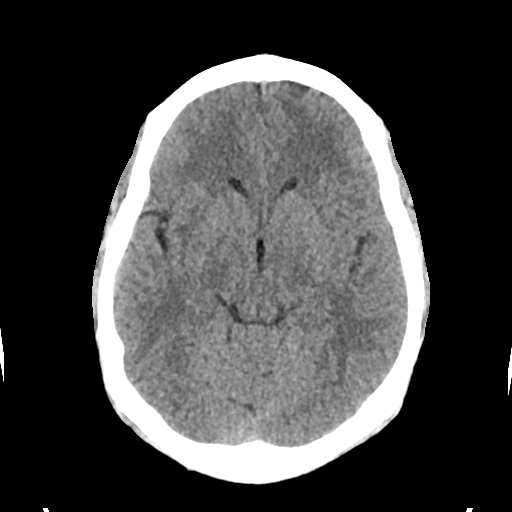
[im 16/31  brain]
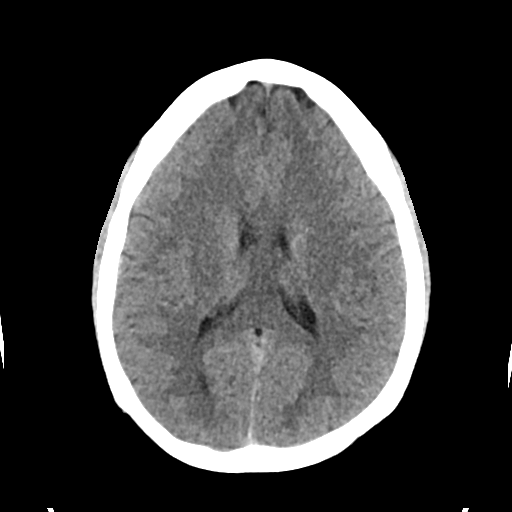
[im 19/31  brain]
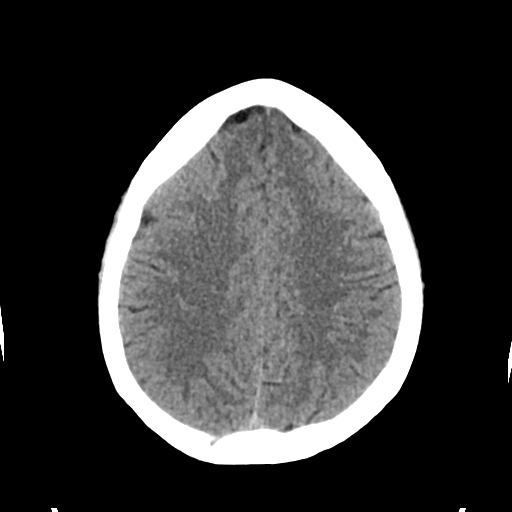
[im 19/31  bone]
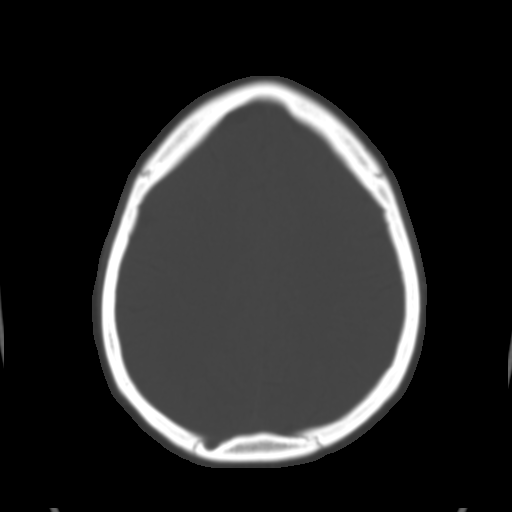
[im 23/31  brain]
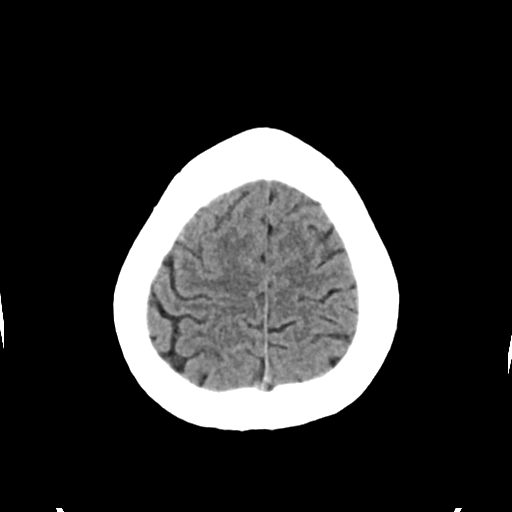
[im 27/31  brain]
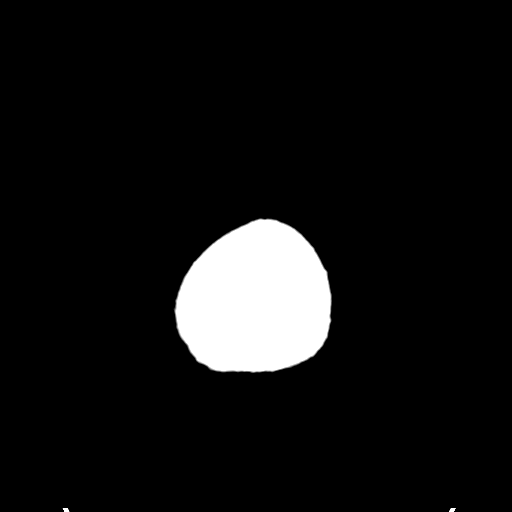

[Series 4: head bone · axial · 0.35mm/px · z∈[+890,+920]mm · 3 of 76 slices shown]
[im 8/76  bone]
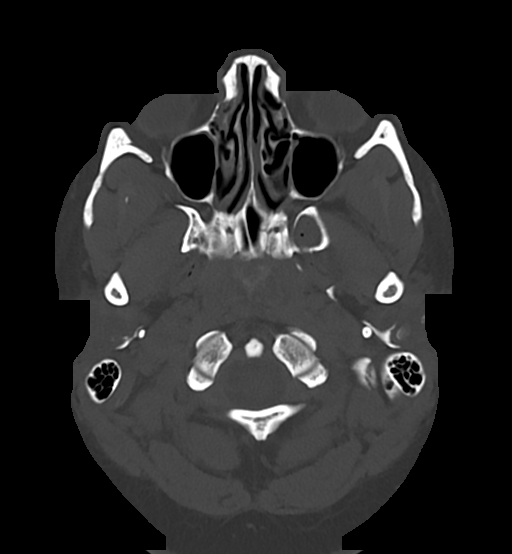
[im 16/76  bone]
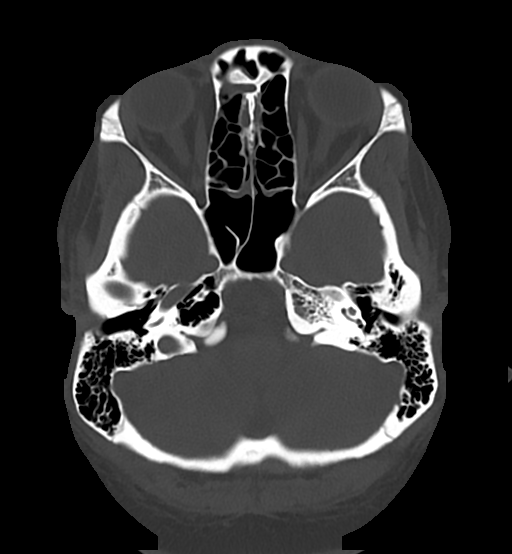
[im 23/76  bone]
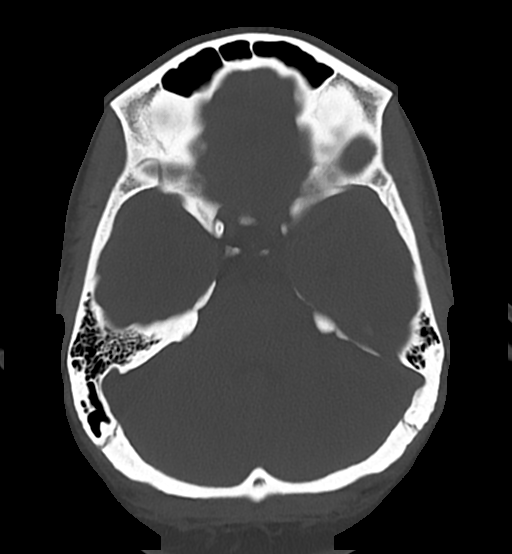

[Series 5: cor soft · coronal · 0.30mm/px · 3 of 68 slices shown]
[im 23/68  brain]
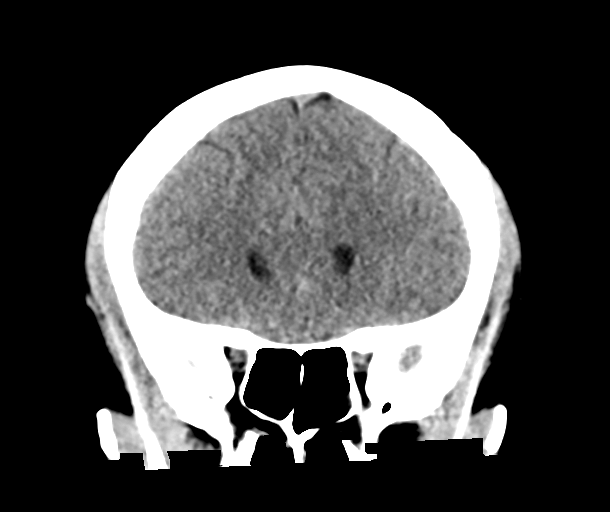
[im 30/68  brain]
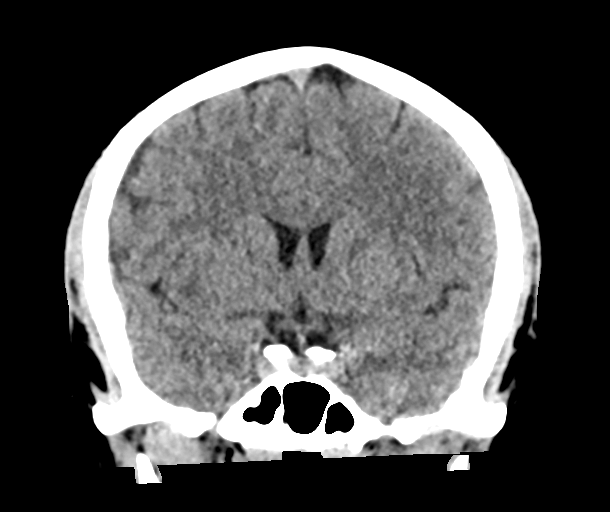
[im 38/68  brain]
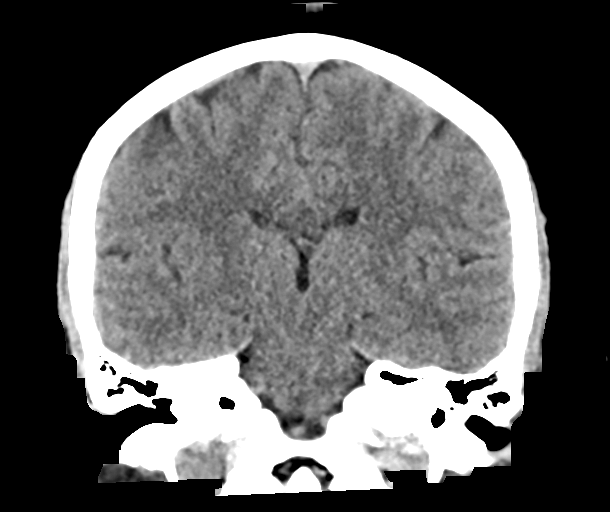

[Series 6: sag soft · sagittal · 0.30mm/px · 3 of 55 slices shown]
[im 19/55  brain]
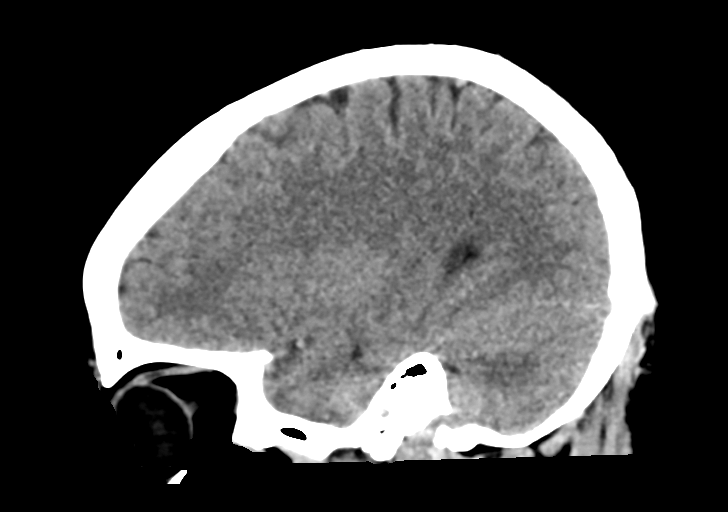
[im 28/55  brain]
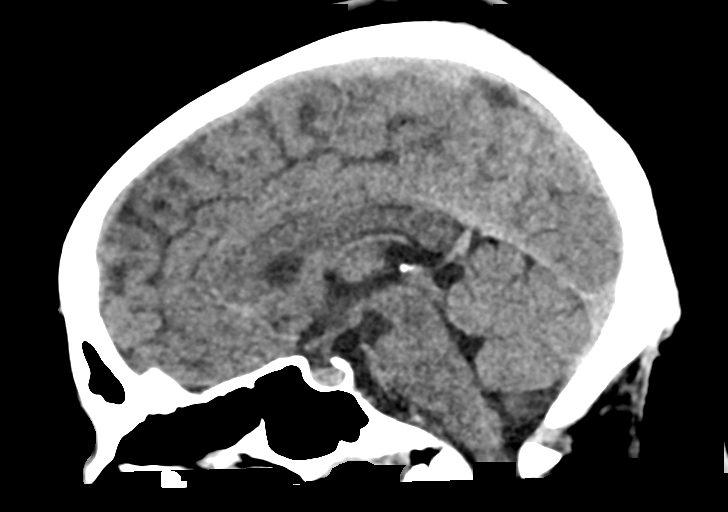
[im 37/55  brain]
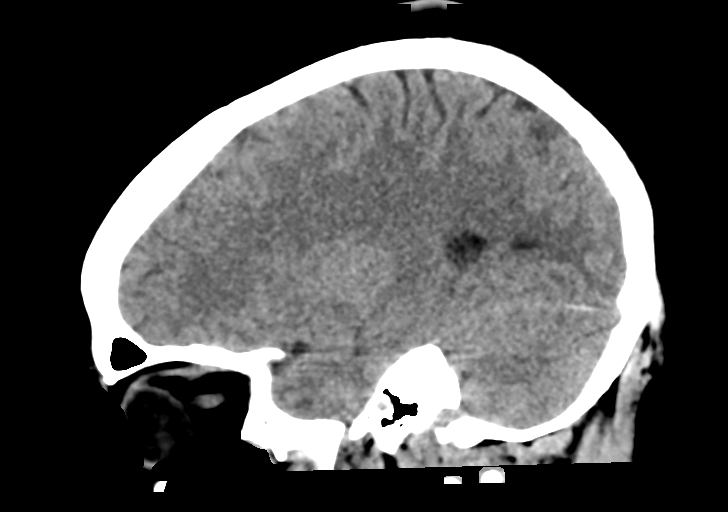

[16 of 47 positions shown; findings below may reference images not displayed]

FINDINGS: Brain: No evidence of acute infarction, hemorrhage, hydrocephalus,
extra-axial collection or mass lesion/mass effect.

Vascular: No hyperdense vessel or unexpected calcification.

Skull: Normal. Negative for fracture or focal lesion.

Sinuses/Orbits: There is minimal paranasal sinus mucosal thickening.
No air-fluid levels or sinus wall fractures.

Other: RIGHT posterior occipital scalp hematoma without underlying
fracture.
IMPRESSION: 1.  No evidence for acute intracranial abnormality.
2. RIGHT posterior occipital scalp hematoma without underlying
fracture.

## 2021-11-16 ENCOUNTER — Other Ambulatory Visit: Payer: Self-pay | Admitting: Internal Medicine

## 2021-11-16 DIAGNOSIS — N926 Irregular menstruation, unspecified: Secondary | ICD-10-CM

## 2021-11-17 NOTE — Telephone Encounter (Signed)
Requested medication (s) are due for refill today: yes  Requested medication (s) are on the active medication list: no  Last refill:  10/22/20 #28 6   Future visit scheduled: no  Notes to clinic:  Prescription request for Cryselle, [place]   Requested Prescriptions  Pending Prescriptions Disp Refills   CRYSELLE-28 0.3-30 MG-MCG tablet [Pharmacy Med Name: CRYSELLE TABLETS 28] 28 tablet 6    Sig: TAKE 1 TABLET BY MOUTH DAILY     OB/GYN:  Contraceptives Failed - 11/16/2021  3:46 AM      Failed - Valid encounter within last 12 months    Recent Outpatient Visits           1 year ago Irregular menses   [facility] Marcine Matar, MD   1 year ago Secondary amenorrhea   [facility] Marcine Matar, MD              Passed - Last BP in normal range    BP Readings from Last 1 Encounters:  06/23/21 97/65

## 2022-02-05 ENCOUNTER — Other Ambulatory Visit: Payer: Self-pay | Admitting: Nurse Practitioner

## 2022-02-06 ENCOUNTER — Other Ambulatory Visit: Payer: Self-pay | Admitting: Nurse Practitioner

## 2022-02-06 DIAGNOSIS — R7989 Other specified abnormal findings of blood chemistry: Secondary | ICD-10-CM

## 2022-07-24 ENCOUNTER — Other Ambulatory Visit (HOSPITAL_COMMUNITY)
Admission: RE | Admit: 2022-07-24 | Discharge: 2022-07-24 | Disposition: A | Discharge status: Home / Self Care | Payer: Medicaid Other | Source: Ambulatory Visit | Attending: Nurse Practitioner | Admitting: Nurse Practitioner

## 2022-07-24 ENCOUNTER — Other Ambulatory Visit: Payer: Self-pay | Admitting: Nurse Practitioner

## 2022-07-24 DIAGNOSIS — Z124 Encounter for screening for malignant neoplasm of cervix: Secondary | ICD-10-CM | POA: Diagnosis not present

## 2022-07-28 LAB — CYTOLOGY - PAP: Diagnosis: NEGATIVE

## 2022-08-21 ENCOUNTER — Other Ambulatory Visit: Payer: Medicaid Other

## 2023-03-19 ENCOUNTER — Other Ambulatory Visit: Payer: Self-pay

## 2023-03-19 ENCOUNTER — Emergency Department (HOSPITAL_COMMUNITY)
Admission: EM | Admit: 2023-03-19 | Discharge: 2023-03-19 | Disposition: A | Discharge status: Home / Self Care | Payer: Medicaid Other | Attending: Emergency Medicine | Admitting: Emergency Medicine

## 2023-03-19 DIAGNOSIS — M545 Low back pain, unspecified: Secondary | ICD-10-CM | POA: Diagnosis not present

## 2023-03-19 MED ORDER — NAPROXEN 500 MG PO TABS: 500.0000 mg | ORAL_TABLET | Freq: Two times a day (BID) | ORAL | 0 refills | Status: AC

## 2023-03-19 MED ORDER — METHOCARBAMOL 500 MG PO TABS: 500.0000 mg | ORAL_TABLET | Freq: Two times a day (BID) | ORAL | 0 refills | Status: AC

## 2023-03-19 NOTE — ED Provider Notes (Signed)
[place] Provider Note   CSN: [id number] Arrival date & time: 03/19/23  0507     History  Chief Complaint  Patient presents with   Back Pain    Pamela Barton is a 22 y.o. female.  Patient presents to the emergency department complaining of lower left-sided back pain.  She states this pain is been ongoing for 3 weeks to a month.  She states she believes it started after lifting heavy boxes at work.  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  She denies urinary incontinence, urinary retention, fecal incontinence, saddle anesthesia, IV drug use.  Past medical history significant for prediabetes  HPI     Home Medications Prior to Admission medications   Medication Sig Start Date End Date Taking? Authorizing Provider  methocarbamol (ROBAXIN) 500 MG tablet Take 1 tablet (500 mg total) by mouth 2 (two) times daily. 03/19/23  Yes Dorothyann Peng, PA-C  naproxen (NAPROSYN) 500 MG tablet Take 1 tablet (500 mg total) by mouth 2 (two) times daily. 03/19/23  Yes Dorothyann Peng, PA-C  norgestrel-ethinyl estradiol (LO/OVRAL) 0.3-30 MG-MCG tablet Take 1 tablet by mouth daily. 10/22/20   Ladell Pier, MD      Allergies    Patient has no known allergies.    Review of Systems   Review of Systems  Physical Exam Updated Vital Signs BP (!) 143/97 (BP Location: Right Arm)   Pulse 70   Temp 98.1 F (36.7 C) (Oral)   Resp 16   Ht 5\' 5"  (1.651 m)   Wt 102.5 kg   SpO2 100%   BMI 37.61 kg/m  Physical Exam HENT:     Head: Normocephalic and atraumatic.  Eyes:     Conjunctiva/sclera: Conjunctivae normal.  Cardiovascular:     Rate and Rhythm: Normal rate.  Pulmonary:     Effort: Pulmonary effort is normal. No respiratory distress.  Musculoskeletal:        General: Tenderness present. No signs of injury.     Cervical back: Normal range of motion.     Comments: Tenderness to palpation of  the lower left back.  No midline spinal tenderness noted  Skin:    General: Skin is dry.  Neurological:     General: No focal deficit present.     Mental Status: She is alert and oriented to person, place, and time.     Comments: Patient with a normal gait, normal sensation bilaterally in the lower extremities, equal strength bilaterally in the lower extremities  Psychiatric:        Speech: Speech normal.        Behavior: Behavior normal.     ED Results / Procedures / Treatments   Labs (all labs ordered are listed, but only abnormal results are displayed) Labs Reviewed - No data to display  EKG None  Radiology No results found.  Procedures Procedures    Medications Ordered in ED Medications - No data to display  ED Course/ Medical Decision Making/ A&P                             Medical Decision Making  Patient presents with a chief complaint of low back pain.  Differential diagnosis includes but is not limited to muscle strain, fracture, dislocation, disc herniation, others  The patient is comorbidities including obesity.  I reviewed the patient's past medical  history including a primary care note documenting need for further evaluation of possible PCOS  There is no indication at this time for laboratory work or imaging  Patient had no trauma to suggest a fracture or dislocation.  She has no radicular symptoms and no midline pain.  Her symptoms of ongoing 3 to 4 weeks.  There is no indication at this time for emergent imaging.  Her symptoms are most consistent with a muscle strain.  Plan to discharge patient home with prescription for Naprosyn and methocarbamol.  Patient given instructions supportive care including heat and rest.  Patient will follow-up with her primary care provider for further evaluation and management        Final Clinical Impression(s) / ED Diagnoses Final diagnoses:  Acute left-sided low back pain without sciatica    Rx / DC Orders ED  Discharge Orders          Ordered    methocarbamol (ROBAXIN) 500 MG tablet  2 times daily        03/19/23 0537    naproxen (NAPROSYN) 500 MG tablet  2 times daily        03/19/23 0537              Dorothyann Peng, PA-C 03/19/23 [id number]    Merrily Pew, MD 03/19/23 2255

## 2023-03-19 NOTE — ED Triage Notes (Signed)
Pt c/o lower back pain L>R x 1 month, worse last 4 days

## 2023-03-19 NOTE — Discharge Instructions (Addendum)
You were evaluated today for low back pain.  Your pain is consistent with a muscle strain.  I have prescribed methocarbamol which is a muscle relaxant.  Please do not take this if you are going to be driving or operating heavy machinery as it may cause drowsiness.  The Naprosyn to be taken twice daily as an anti-inflammatory.  Do not take other NSAID medications while taking the Naprosyn.  You may continue to take Tylenol as needed.  Please follow-up with your primary care provider for further evaluation and management of your low back pain.

## 2023-08-18 ENCOUNTER — Encounter: Payer: Self-pay | Admitting: Internal Medicine

## 2023-08-19 ENCOUNTER — Other Ambulatory Visit: Payer: Self-pay | Admitting: Internal Medicine

## 2023-08-19 DIAGNOSIS — R7989 Other specified abnormal findings of blood chemistry: Secondary | ICD-10-CM

## 2023-09-04 ENCOUNTER — Ambulatory Visit
Admission: RE | Admit: 2023-09-04 | Discharge: 2023-09-04 | Disposition: A | Discharge status: Home / Self Care | Payer: Medicaid Other | Source: Ambulatory Visit | Attending: Internal Medicine | Admitting: Internal Medicine

## 2023-09-04 DIAGNOSIS — R7989 Other specified abnormal findings of blood chemistry: Secondary | ICD-10-CM

## 2023-09-04 MED ORDER — IOPAMIDOL (ISOVUE-300) INJECTION 61%
500.0000 mL | Freq: Once | INTRAVENOUS | Status: AC | PRN
  Administered 2023-09-04: 100 mL via INTRAVENOUS

## 2024-09-02 ENCOUNTER — Other Ambulatory Visit (HOSPITAL_COMMUNITY): Payer: Self-pay | Admitting: Internal Medicine

## 2024-09-02 DIAGNOSIS — R519 Headache, unspecified: Secondary | ICD-10-CM

## 2024-09-19 ENCOUNTER — Ambulatory Visit (HOSPITAL_COMMUNITY)
Admission: RE | Admit: 2024-09-19 | Discharge: 2024-09-19 | Disposition: A | Discharge status: Home / Self Care | Source: Ambulatory Visit | Attending: Internal Medicine | Admitting: Internal Medicine

## 2024-09-19 DIAGNOSIS — H538 Other visual disturbances: Secondary | ICD-10-CM

## 2024-09-19 DIAGNOSIS — R519 Headache, unspecified: Secondary | ICD-10-CM | POA: Diagnosis present

## 2024-09-19 DIAGNOSIS — J323 Chronic sphenoidal sinusitis: Secondary | ICD-10-CM | POA: Diagnosis not present
